# Patient Record
Sex: Female | Born: 1975 | Hispanic: No | Marital: Married | Smoking: Never smoker
Health system: Southern US, Community
[De-identification: ages and names within clinical notes are randomized; demographics above are authoritative.]

## PROBLEM LIST (undated history)

## (undated) ENCOUNTER — Inpatient Hospital Stay (HOSPITAL_COMMUNITY): Payer: Self-pay

## (undated) DIAGNOSIS — O99013 Anemia complicating pregnancy, third trimester: Principal | ICD-10-CM

## (undated) DIAGNOSIS — O30049 Twin pregnancy, dichorionic/diamniotic, unspecified trimester: Secondary | ICD-10-CM

## (undated) DIAGNOSIS — E079 Disorder of thyroid, unspecified: Secondary | ICD-10-CM

## (undated) DIAGNOSIS — E282 Polycystic ovarian syndrome: Secondary | ICD-10-CM

## (undated) DIAGNOSIS — E039 Hypothyroidism, unspecified: Secondary | ICD-10-CM

## (undated) DIAGNOSIS — O09813 Supervision of pregnancy resulting from assisted reproductive technology, third trimester: Secondary | ICD-10-CM

## (undated) HISTORY — PX: NO PAST SURGERIES: SHX2092

---

## 2015-02-04 ENCOUNTER — Other Ambulatory Visit (HOSPITAL_COMMUNITY)
Admission: RE | Admit: 2015-02-04 | Discharge: 2015-02-04 | Disposition: A | Discharge status: Home / Self Care | Payer: BLUE CROSS/BLUE SHIELD | Source: Ambulatory Visit | Attending: Nurse Practitioner | Admitting: Nurse Practitioner

## 2015-02-04 DIAGNOSIS — Z1151 Encounter for screening for human papillomavirus (HPV): Secondary | ICD-10-CM | POA: Insufficient documentation

## 2015-02-04 DIAGNOSIS — Z01419 Encounter for gynecological examination (general) (routine) without abnormal findings: Secondary | ICD-10-CM | POA: Insufficient documentation

## 2015-02-10 ENCOUNTER — Other Ambulatory Visit (HOSPITAL_COMMUNITY): Payer: Self-pay | Admitting: Nurse Practitioner

## 2015-02-10 DIAGNOSIS — N979 Female infertility, unspecified: Secondary | ICD-10-CM

## 2015-02-11 ENCOUNTER — Ambulatory Visit (HOSPITAL_COMMUNITY): Admission: RE | Admit: 2015-02-11 | Payer: BLUE CROSS/BLUE SHIELD | Source: Ambulatory Visit

## 2016-06-21 NOTE — L&D Delivery Note (Signed)
  Christine Noble, Christine Noble [[id number]]  Twin A:  Vaginal Delivery:  Patient was pushing well, getting discouraged. Tight perineal body noted and was holding back head delivery. Episiotomy discussed and agreed. Left medio-lateral episiotomy placed after injecting 1% plain Lidocaine.  At 7:52 PM a viable and healthy female was delivered via Vaginal, Spontaneous Delivery.  Presentation: vertex; Position: Left,, Occiput,, Anterior; Station: +5.  APGAR: 9, 9; weight 4 lb 7.3 oz (2021 g).     Body cord reduced at delivery and baby put on mom's abdomen.  Cord complications: body cord.  Cord pH: N/A Delayed cord clamping done (less than 1 minute) as baby needed stimulation and twin B position evaluation was needed. So baby handed off to RN in the warmer.   Twin B:  Bedside sono and pelvic exam done, twin B head was in right lower quadrant above pelvic inlet. Baby was made longitudinal by pushing from both sides on the abdomen by CNM Sigmon. Pelvic exam done, BBOW noted with head just above the inlet. Amniotomy done with FSE to creat small puncture and copious clear fluid drained. Suprapubic pressure maintained to keep the head from moving up.    Twin B deceleration started and vaginal exam noted station at -5 with cord prolapse. Mother was made supine and head was gently elevated to reduce cord while OR was called for C/section.  Mother pushed well and fetus descended in pelvis. Verbal consent for Vacuum was obtained and agreed.     Christine Noble, Christine Noble [[id number]][[id number]]  Operative Vaginal Delivery Note At 8:02 PM a viable and healthy female was delivered via Vaginal, Vacuum (Extractor), one application, 2 pushing, no pressure release in between.  Presentation: vertex; Position: Occiput,, Anterior; Station: +3.  APGAR: 8, 9; weight 4 lb 15 oz (2240 g).   Cord:  with the following complications: Cord prolapse but reduced to complete vaginal delivery Cord pH (arterial):  7.12 Immediate cord clamping was done and cut  and baby handed to RN for stimulation.   Anesthesia:  Epidural and 1% lidocaine local for episiotomy Instruments: Mushroom vacuum  Episiotomy: Left Mediolateral Lacerations:  None other  Suture Repair: 3.0 vicryl rapide Est. Blood Loss (mL): 600  Mom to postpartum.  Baby to Couplet care / Skin to Skin. Twin A smaller but >2000 gms, can with mother per NICU and will have nursery follow since stable.   Christine Noble 03/01/2017, 8:39 PM

## 2016-08-24 LAB — OB RESULTS CONSOLE ABO/RH: RH Type: POSITIVE

## 2016-08-24 LAB — OB RESULTS CONSOLE RPR: RPR: NONREACTIVE

## 2016-08-24 LAB — OB RESULTS CONSOLE GC/CHLAMYDIA
CHLAMYDIA, DNA PROBE: NEGATIVE
GC PROBE AMP, GENITAL: NEGATIVE

## 2016-08-24 LAB — OB RESULTS CONSOLE ANTIBODY SCREEN: ANTIBODY SCREEN: NEGATIVE

## 2016-08-24 LAB — OB RESULTS CONSOLE HIV ANTIBODY (ROUTINE TESTING): HIV: NONREACTIVE

## 2016-08-24 LAB — OB RESULTS CONSOLE RUBELLA ANTIBODY, IGM: Rubella: IMMUNE

## 2016-08-24 LAB — OB RESULTS CONSOLE HEPATITIS B SURFACE ANTIGEN: HEP B S AG: NEGATIVE

## 2016-12-30 ENCOUNTER — Other Ambulatory Visit (HOSPITAL_COMMUNITY): Payer: Self-pay | Admitting: Obstetrics & Gynecology

## 2016-12-30 DIAGNOSIS — O30043 Twin pregnancy, dichorionic/diamniotic, third trimester: Secondary | ICD-10-CM

## 2016-12-30 DIAGNOSIS — O09523 Supervision of elderly multigravida, third trimester: Secondary | ICD-10-CM

## 2017-01-11 ENCOUNTER — Encounter (HOSPITAL_COMMUNITY): Payer: Self-pay | Admitting: *Deleted

## 2017-01-12 ENCOUNTER — Other Ambulatory Visit (HOSPITAL_COMMUNITY): Payer: Self-pay | Admitting: Obstetrics & Gynecology

## 2017-01-12 ENCOUNTER — Encounter (HOSPITAL_COMMUNITY): Payer: Self-pay

## 2017-01-12 ENCOUNTER — Ambulatory Visit (HOSPITAL_COMMUNITY)
Admission: RE | Admit: 2017-01-12 | Discharge: 2017-01-12 | Disposition: A | Discharge status: Home / Self Care | Payer: BLUE CROSS/BLUE SHIELD | Source: Ambulatory Visit | Attending: Obstetrics & Gynecology | Admitting: Obstetrics & Gynecology

## 2017-01-12 DIAGNOSIS — Z3A30 30 weeks gestation of pregnancy: Secondary | ICD-10-CM | POA: Diagnosis not present

## 2017-01-12 DIAGNOSIS — E039 Hypothyroidism, unspecified: Secondary | ICD-10-CM | POA: Insufficient documentation

## 2017-01-12 DIAGNOSIS — O09523 Supervision of elderly multigravida, third trimester: Secondary | ICD-10-CM

## 2017-01-12 DIAGNOSIS — Z3689 Encounter for other specified antenatal screening: Secondary | ICD-10-CM | POA: Diagnosis not present

## 2017-01-12 DIAGNOSIS — O99213 Obesity complicating pregnancy, third trimester: Secondary | ICD-10-CM | POA: Insufficient documentation

## 2017-01-12 DIAGNOSIS — O30043 Twin pregnancy, dichorionic/diamniotic, third trimester: Secondary | ICD-10-CM

## 2017-01-12 DIAGNOSIS — O09513 Supervision of elderly primigravida, third trimester: Secondary | ICD-10-CM | POA: Insufficient documentation

## 2017-01-12 DIAGNOSIS — O09813 Supervision of pregnancy resulting from assisted reproductive technology, third trimester: Secondary | ICD-10-CM | POA: Diagnosis not present

## 2017-01-12 DIAGNOSIS — O99283 Endocrine, nutritional and metabolic diseases complicating pregnancy, third trimester: Secondary | ICD-10-CM | POA: Diagnosis not present

## 2017-01-12 HISTORY — DX: Disorder of thyroid, unspecified: E07.9

## 2017-01-12 HISTORY — DX: Polycystic ovarian syndrome: E28.2

## 2017-01-18 ENCOUNTER — Encounter (HOSPITAL_COMMUNITY): Payer: Self-pay

## 2017-01-25 ENCOUNTER — Other Ambulatory Visit: Payer: Self-pay | Admitting: Obstetrics & Gynecology

## 2017-01-27 ENCOUNTER — Encounter (HOSPITAL_COMMUNITY): Payer: Self-pay | Admitting: Obstetrics & Gynecology

## 2017-01-27 ENCOUNTER — Inpatient Hospital Stay (HOSPITAL_COMMUNITY)
Admission: AD | Admit: 2017-01-27 | Discharge: 2017-01-27 | Discharge status: Absent without leave | Payer: BLUE CROSS/BLUE SHIELD | Source: Ambulatory Visit | Attending: Obstetrics & Gynecology | Admitting: Obstetrics & Gynecology

## 2017-01-27 DIAGNOSIS — O30049 Twin pregnancy, dichorionic/diamniotic, unspecified trimester: Secondary | ICD-10-CM | POA: Diagnosis present

## 2017-01-27 DIAGNOSIS — O99013 Anemia complicating pregnancy, third trimester: Secondary | ICD-10-CM

## 2017-01-27 DIAGNOSIS — O09813 Supervision of pregnancy resulting from assisted reproductive technology, third trimester: Secondary | ICD-10-CM

## 2017-01-27 HISTORY — DX: Twin pregnancy, dichorionic/diamniotic, unspecified trimester: O30.049

## 2017-01-27 HISTORY — DX: Anemia complicating pregnancy, third trimester: O99.013

## 2017-01-27 HISTORY — DX: Supervision of pregnancy resulting from assisted reproductive technology, third trimester: O09.813

## 2017-01-27 NOTE — MAU Note (Signed)
Pt stated she will come back in the morning for her infusion.

## 2017-01-28 ENCOUNTER — Inpatient Hospital Stay (HOSPITAL_COMMUNITY)
Admission: AD | Admit: 2017-01-28 | Discharge: 2017-01-28 | Disposition: A | Discharge status: Home / Self Care | Payer: BLUE CROSS/BLUE SHIELD | Source: Ambulatory Visit | Attending: Obstetrics & Gynecology | Admitting: Obstetrics & Gynecology

## 2017-01-28 DIAGNOSIS — Z3A00 Weeks of gestation of pregnancy not specified: Secondary | ICD-10-CM | POA: Insufficient documentation

## 2017-01-28 DIAGNOSIS — O99019 Anemia complicating pregnancy, unspecified trimester: Secondary | ICD-10-CM | POA: Insufficient documentation

## 2017-01-28 MED ORDER — SODIUM CHLORIDE 0.9 % IV SOLN
510.0000 mg | Freq: Once | INTRAVENOUS | Status: AC
  Administered 2017-01-28: 510 mg via INTRAVENOUS
  Filled 2017-01-28: qty 17

## 2017-02-02 ENCOUNTER — Inpatient Hospital Stay (HOSPITAL_COMMUNITY)
Admission: AD | Admit: 2017-02-02 | Discharge: 2017-02-02 | Disposition: A | Discharge status: Home / Self Care | Payer: BLUE CROSS/BLUE SHIELD | Source: Ambulatory Visit | Attending: Obstetrics and Gynecology | Admitting: Obstetrics and Gynecology

## 2017-02-02 DIAGNOSIS — O26893 Other specified pregnancy related conditions, third trimester: Secondary | ICD-10-CM | POA: Diagnosis present

## 2017-02-02 DIAGNOSIS — Z3A33 33 weeks gestation of pregnancy: Secondary | ICD-10-CM | POA: Diagnosis not present

## 2017-02-02 DIAGNOSIS — D649 Anemia, unspecified: Secondary | ICD-10-CM | POA: Diagnosis present

## 2017-02-02 DIAGNOSIS — O09813 Supervision of pregnancy resulting from assisted reproductive technology, third trimester: Secondary | ICD-10-CM

## 2017-02-02 DIAGNOSIS — O99013 Anemia complicating pregnancy, third trimester: Secondary | ICD-10-CM

## 2017-02-02 MED ORDER — SODIUM CHLORIDE 0.9 % IV SOLN
510.0000 mg | Freq: Once | INTRAVENOUS | Status: AC
  Administered 2017-02-02: 510 mg via INTRAVENOUS
  Filled 2017-02-02: qty 17

## 2017-02-02 NOTE — MAU Note (Signed)
Here for IV iron

## 2017-02-11 LAB — OB RESULTS CONSOLE GBS: GBS: POSITIVE

## 2017-02-18 ENCOUNTER — Encounter (HOSPITAL_COMMUNITY): Payer: Self-pay | Admitting: *Deleted

## 2017-02-18 ENCOUNTER — Inpatient Hospital Stay (HOSPITAL_COMMUNITY): Payer: BLUE CROSS/BLUE SHIELD

## 2017-02-18 ENCOUNTER — Inpatient Hospital Stay (HOSPITAL_COMMUNITY)
Admission: AD | Admit: 2017-02-18 | Discharge: 2017-02-18 | Disposition: A | Discharge status: Home / Self Care | Payer: BLUE CROSS/BLUE SHIELD | Source: Ambulatory Visit | Attending: Obstetrics and Gynecology | Admitting: Obstetrics and Gynecology

## 2017-02-18 DIAGNOSIS — E282 Polycystic ovarian syndrome: Secondary | ICD-10-CM | POA: Diagnosis not present

## 2017-02-18 DIAGNOSIS — O99013 Anemia complicating pregnancy, third trimester: Secondary | ICD-10-CM | POA: Insufficient documentation

## 2017-02-18 DIAGNOSIS — O30043 Twin pregnancy, dichorionic/diamniotic, third trimester: Secondary | ICD-10-CM | POA: Diagnosis not present

## 2017-02-18 DIAGNOSIS — Z79899 Other long term (current) drug therapy: Secondary | ICD-10-CM | POA: Insufficient documentation

## 2017-02-18 DIAGNOSIS — E079 Disorder of thyroid, unspecified: Secondary | ICD-10-CM | POA: Insufficient documentation

## 2017-02-18 DIAGNOSIS — R030 Elevated blood-pressure reading, without diagnosis of hypertension: Secondary | ICD-10-CM

## 2017-02-18 DIAGNOSIS — O288 Other abnormal findings on antenatal screening of mother: Secondary | ICD-10-CM | POA: Insufficient documentation

## 2017-02-18 DIAGNOSIS — Z3689 Encounter for other specified antenatal screening: Secondary | ICD-10-CM | POA: Diagnosis not present

## 2017-02-18 DIAGNOSIS — Z3A35 35 weeks gestation of pregnancy: Secondary | ICD-10-CM | POA: Insufficient documentation

## 2017-02-18 DIAGNOSIS — O30033 Twin pregnancy, monochorionic/diamniotic, third trimester: Secondary | ICD-10-CM

## 2017-02-18 DIAGNOSIS — O99283 Endocrine, nutritional and metabolic diseases complicating pregnancy, third trimester: Secondary | ICD-10-CM | POA: Insufficient documentation

## 2017-02-18 LAB — URINALYSIS, ROUTINE W REFLEX MICROSCOPIC
Bilirubin Urine: NEGATIVE
Glucose, UA: NEGATIVE mg/dL
Hgb urine dipstick: NEGATIVE
Ketones, ur: NEGATIVE mg/dL
Leukocytes, UA: NEGATIVE
NITRITE: NEGATIVE
Protein, ur: NEGATIVE mg/dL
SPECIFIC GRAVITY, URINE: 1.008 (ref 1.005–1.030)
pH: 6 (ref 5.0–8.0)

## 2017-02-18 LAB — PROTEIN / CREATININE RATIO, URINE
CREATININE, URINE: 38 mg/dL
Total Protein, Urine: <6 mg/dL

## 2017-02-18 LAB — CBC
HEMATOCRIT: 36.4 % (ref 36.0–46.0)
Hemoglobin: 11.5 g/dL — ABNORMAL LOW (ref 12.0–15.0)
MCH: 23.3 pg — ABNORMAL LOW (ref 26.0–34.0)
MCHC: 31.6 g/dL (ref 30.0–36.0)
MCV: 73.8 fL — AB (ref 78.0–100.0)
PLATELETS: 264 10*3/uL (ref 150–400)
RBC: 4.93 MIL/uL (ref 3.87–5.11)
RDW: 15.6 % — ABNORMAL HIGH (ref 11.5–15.5)
WBC: 8.7 10*3/uL (ref 4.0–10.5)

## 2017-02-18 LAB — COMPREHENSIVE METABOLIC PANEL
ALT: 11 U/L — ABNORMAL LOW (ref 14–54)
AST: 15 U/L (ref 15–41)
Albumin: 2.4 g/dL — ABNORMAL LOW (ref 3.5–5.0)
Alkaline Phosphatase: 211 U/L — ABNORMAL HIGH (ref 38–126)
Anion gap: 8 (ref 5–15)
BUN: 6 mg/dL (ref 6–20)
CHLORIDE: 106 mmol/L (ref 101–111)
CO2: 23 mmol/L (ref 22–32)
CREATININE: 0.63 mg/dL (ref 0.44–1.00)
Calcium: 8.9 mg/dL (ref 8.9–10.3)
GFR calc Af Amer: >60 mL/min (ref >60)
GLUCOSE: 88 mg/dL (ref 65–99)
Potassium: 4.2 mmol/L (ref 3.5–5.1)
Sodium: 137 mmol/L (ref 135–145)
Total Bilirubin: 0.4 mg/dL (ref 0.3–1.2)
Total Protein: 6.6 g/dL (ref 6.5–8.1)

## 2017-02-18 NOTE — MAU Note (Signed)
Pt sent from office with 2/8 BPP of twins. Need further monitoring and repeat U/S.

## 2017-02-18 NOTE — Discharge Instructions (Signed)
Fetal Movement Counts °Patient Name: ________________________________________________ Patient Due Date: ____________________ °What is a fetal movement count? °A fetal movement count is the number of times that you feel your baby move during a certain amount of time. This may also be called a fetal kick count. A fetal movement count is recommended for every pregnant woman. You may be asked to start counting fetal movements as early as week 28 of your pregnancy. °Pay attention to when your baby is most active. You may notice your baby's sleep and wake cycles. You may also notice things that make your baby move more. You should do a fetal movement count: °· When your baby is normally most active. °· At the same time each day. ° °A good time to count movements is while you are resting, after having something to eat and drink. °How do I count fetal movements? °1. Find a quiet, comfortable area. Sit, or lie down on your side. °2. Write down the date, the start time and stop time, and the number of movements that you felt between those two times. Take this information with you to your health care visits. °3. For 2 hours, count kicks, flutters, swishes, rolls, and jabs. You should feel at least 10 movements during 2 hours. °4. You may stop counting after you have felt 10 movements. °5. If you do not feel 10 movements in 2 hours, have something to eat and drink. Then, keep resting and counting for 1 hour. If you feel at least 4 movements during that hour, you may stop counting. °Contact a health care provider if: °· You feel fewer than 4 movements in 2 hours. °· Your baby is not moving like he or she usually does. °Date: ____________ Start time: ____________ Stop time: ____________ Movements: ____________ °Date: ____________ Start time: ____________ Stop time: ____________ Movements: ____________ °Date: ____________ Start time: ____________ Stop time: ____________ Movements: ____________ °Date: ____________ Start time:  ____________ Stop time: ____________ Movements: ____________ °Date: ____________ Start time: ____________ Stop time: ____________ Movements: ____________ °Date: ____________ Start time: ____________ Stop time: ____________ Movements: ____________ °Date: ____________ Start time: ____________ Stop time: ____________ Movements: ____________ °Date: ____________ Start time: ____________ Stop time: ____________ Movements: ____________ °Date: ____________ Start time: ____________ Stop time: ____________ Movements: ____________ °This information is not intended to replace advice given to you by your health care provider. Make sure you discuss any questions you have with your health care provider. °Document Released: 07/07/2006 Document Revised: 02/04/2016 Document Reviewed: 07/17/2015 °Elsevier Interactive Patient Education © 2018 [employer] ° ° ° °Hypertension During Pregnancy °Hypertension, commonly called high blood pressure, is when the force of blood pumping through your arteries is too strong. Arteries are blood vessels that carry blood from the heart throughout the body. Hypertension during pregnancy can cause problems for you and your baby. Your baby may be born early (prematurely) or may not weigh as much as he or she should at birth. Very bad cases of hypertension during pregnancy can be life-threatening. °Different types of hypertension can occur during pregnancy. These include: °· Chronic hypertension. This happens when: °? You have hypertension before pregnancy and it continues during pregnancy. °? You develop hypertension before you are [redacted] weeks pregnant, and it continues during pregnancy. °· Gestational hypertension. This is hypertension that develops after the 20th week of pregnancy. °· Preeclampsia, also called toxemia of pregnancy. This is a very serious type of hypertension that develops only during pregnancy. It affects the whole body, and it can be very dangerous for you and   your baby. ° °Gestational  hypertension and preeclampsia usually go away within 6 weeks after your baby is born. Women who have hypertension during pregnancy have a greater chance of developing hypertension later in life or during future pregnancies. °What are the causes? °The exact cause of hypertension is not known. °What increases the risk? °There are certain factors that make it more likely for you to develop hypertension during pregnancy. These include: °· Having hypertension during a previous pregnancy or prior to pregnancy. °· Being overweight. °· Being older than age 40. °· Being pregnant for the first time or being pregnant with more than one baby. °· Becoming pregnant using fertilization methods such as IVF (in vitro fertilization). °· Having diabetes, kidney problems, or systemic lupus erythematosus. °· Having a family history of hypertension. ° °What are the signs or symptoms? °Chronic hypertension and gestational hypertension rarely cause symptoms. Preeclampsia causes symptoms, which may include: °· Increased protein in your urine. Your health care provider will check for this at every visit before you give birth (prenatal visit). °· Severe headaches. °· Sudden weight gain. °· Swelling of the hands, face, legs, and feet. °· Nausea and vomiting. °· Vision problems, such as blurred or double vision. °· Numbness in the face, arms, legs, and feet. °· Dizziness. °· Slurred speech. °· Sensitivity to bright lights. °· Abdominal pain. °· Convulsions. ° °How is this diagnosed? °You may be diagnosed with hypertension during a routine prenatal exam. At each prenatal visit, you may: °· Have a urine test to check for high amounts of protein in your urine. °· Have your blood pressure checked. A blood pressure reading is recorded as two numbers, such as "120 over 80" (or 120/80). The first ("top") number is called the systolic pressure. It is a measure of the pressure in your arteries when your heart beats. The second ("bottom") number is  called the diastolic pressure. It is a measure of the pressure in your arteries as your heart relaxes between beats. Blood pressure is measured in a unit called mm Hg. A normal blood pressure reading is: °? Systolic: below 120. °? Diastolic: below 80. ° °The type of hypertension that you are diagnosed with depends on your test results and when your symptoms developed. °· Chronic hypertension is usually diagnosed before 20 weeks of pregnancy. °· Gestational hypertension is usually diagnosed after 20 weeks of pregnancy. °· Hypertension with high amounts of protein in the urine is diagnosed as preeclampsia. °· Blood pressure measurements that stay above 160 systolic, or above 110 diastolic, are signs of severe preeclampsia. ° °How is this treated? °Treatment for hypertension during pregnancy varies depending on the type of hypertension you have and how serious it is. °· If you take medicines called ACE inhibitors to treat chronic hypertension, you may need to switch medicines. ACE inhibitors should not be taken during pregnancy. °· If you have gestational hypertension, you may need to take blood pressure medicine. °· If you are at risk for preeclampsia, your health care provider may recommend that you take a low-dose aspirin every day to prevent high blood pressure during your pregnancy. °· If you have severe preeclampsia, you may need to be hospitalized so you and your baby can be monitored closely. You may also need to take medicine (magnesium sulfate) to prevent seizures and to lower blood pressure. This medicine may be given as an injection or through an IV tube. °· In some cases, if your condition gets worse, you may need to deliver your baby   early. ° °Follow these instructions at home: °Eating and drinking °· Drink enough fluid to keep your urine clear or pale yellow. °· Eat a healthy diet that is low in salt (sodium). Do not add salt to your food. Check food labels to see how much sodium a food or beverage  contains. °Lifestyle °· Do not use any products that contain nicotine or tobacco, such as cigarettes and e-cigarettes. If you need help quitting, ask your health care provider. °· Do not use alcohol. °· Avoid caffeine. °· Avoid stress as much as possible. Rest and get plenty of sleep. °General instructions °· Take over-the-counter and prescription medicines only as told by your health care provider. °· While lying down, lie on your left side. This keeps pressure off your baby. °· While sitting or lying down, raise (elevate) your feet. Try putting some pillows under your lower legs. °· Exercise regularly. Ask your health care provider what kinds of exercise are best for you. °· Keep all prenatal and follow-up visits as told by your health care provider. This is important. °Contact a health care provider if: °· You have symptoms that your health care provider told you may require more treatment or monitoring, such as: °? Fever. °? Vomiting. °? Headache. °Get help right away if: °· You have severe abdominal pain or vomiting that does not get better with treatment. °· You suddenly develop swelling in your hands, ankles, or face. °· You gain 4 lbs (1.8 kg) or more in 1 week. °· You develop vaginal bleeding, or you have blood in your urine. °· You do not feel your baby moving as much as usual. °· You have blurred or double vision. °· You have muscle twitching or sudden tightening (spasms). °· You have shortness of breath. °· Your lips or fingernails turn blue. °This information is not intended to replace advice given to you by your health care provider. Make sure you discuss any questions you have with your health care provider. °Document Released: 02/23/2011 Document Revised: 12/26/2015 Document Reviewed: 11/21/2015 °Elsevier Interactive Patient Education © 2018 [employer] Inc. ° °

## 2017-02-18 NOTE — MAU Provider Note (Signed)
History     CSN: [id number]  Arrival date and time: 02/18/17 1540  First Provider Initiated Contact with Patient 02/18/17 1617      Chief Complaint  Patient presents with  . Non-stress Test   HPI Christine Noble is a 41 y.o. G1P0 at [redacted]w[redacted]d with di/di twins who presents from the office for monitoring. In office today had a BPP of 2/8. Was sent over for fetal monitoring & a repeat BPP. Patient denies abdominal pain, LOF, or vaginal bleeding. Reports positive fetal movement.   OB History    Gravida Para Term Preterm AB Living   1         0   SAB TAB Ectopic Multiple Live Births                  Past Medical History:  Diagnosis Date  . Anemia in pregnancy, third trimester 01/27/2017  . PCOS (polycystic ovarian syndrome)   . Pregnancy resulting from in vitro fertilization in third trimester 01/27/2017  . Thyroid disease   . Twin pregnancy, twins dichorionic and diamniotic 01/27/2017    Past Surgical History:  Procedure Laterality Date  . NO PAST SURGERIES      History reviewed. No pertinent family history.  Social History  Substance Use Topics  . Smoking status: Never Smoker  . Smokeless tobacco: Never Used  . Alcohol use No    Allergies: No Known Allergies  Prescriptions Prior to Admission  Medication Sig Dispense Refill Last Dose  . Prenatal Vit-Fe Fumarate-FA (PRENATAL VITAMIN PO) Take 1 tablet by mouth daily.    Past Week at Unknown time  . THYROID PO Take 50 mg by mouth daily. Medication comes from [place]   02/18/2017 at Unknown time    Review of Systems  Constitutional: Negative.   Gastrointestinal: Negative.   Genitourinary: Negative.    Physical Exam   Blood pressure 138/87, pulse 75, temperature 98.7 F (37.1 C), resp. rate 18, height 5\' 5"  (1.651 m), weight 244 lb (110.7 kg).  Patient Vitals for the past 24 hrs:  BP Temp Pulse Resp Height Weight  02/18/17 1934 138/85 - 67 - - -  02/18/17 1915 (!) 143/99 - 72 - - -  02/18/17 1900 (!) 139/95 - 70 - - -   02/18/17 1846 124/81 - 77 - - -  02/18/17 1835 (!) 144/89 - 76 - - -  02/18/17 1822 128/90 - 79 18 - -  02/18/17 1816 (!) 134/97 - 73 - - -  02/18/17 1549 138/87 98.7 F (37.1 C) 75 18 5\' 5"  (1.651 m) 244 lb (110.7 kg)     Physical Exam  Nursing note and vitals reviewed. Constitutional: She is oriented to person, place, and time. She appears well-developed and well-nourished. No distress.  HENT:  Head: Normocephalic and atraumatic.  Eyes: Conjunctivae are normal. Right eye exhibits no discharge. Left eye exhibits no discharge. No scleral icterus.  Respiratory: Effort normal. No respiratory distress.  Neurological: She is alert and oriented to person, place, and time.  Skin: She is not diaphoretic.  Psychiatric: She has a normal mood and affect. Her behavior is normal. Judgment and thought content normal.   Fetal Tracing: Baby A Baseline: 140 Variability: moderate Accelerations: 15x15 Decelerations: none  Baby B Baseline: 130 Variability: moderat Accelerations:15x15 Decelerations: ?variable w/intermittent tracing  Toco: irr UI MAU Course  Procedures Results for orders placed or performed during the hospital encounter of 02/18/17 (from the past 24 hour(s))  Urinalysis, Routine w reflex microscopic  Status: None   Collection Time: 02/18/17  6:25 PM  Result Value Ref Range   Color, Urine YELLOW YELLOW   APPearance CLEAR CLEAR   Specific Gravity, Urine 1.008 1.005 - 1.030   pH 6.0 5.0 - 8.0   Glucose, UA NEGATIVE NEGATIVE mg/dL   Hgb urine dipstick NEGATIVE NEGATIVE   Bilirubin Urine NEGATIVE NEGATIVE   Ketones, ur NEGATIVE NEGATIVE mg/dL   Protein, ur NEGATIVE NEGATIVE mg/dL   Nitrite NEGATIVE NEGATIVE   Leukocytes, UA NEGATIVE NEGATIVE  Protein / creatinine ratio, urine     Status: None   Collection Time: 02/18/17  6:25 PM  Result Value Ref Range   Creatinine, Urine 38.00 mg/dL   Total Protein, Urine <6 mg/dL   Protein Creatinine Ratio        0.00 - 0.15  mg/mg[Cre]  CBC     Status: Abnormal   Collection Time: 02/18/17  6:42 PM  Result Value Ref Range   WBC 8.7 4.0 - 10.5 K/uL   RBC 4.93 3.87 - 5.11 MIL/uL   Hemoglobin 11.5 (L) 12.0 - 15.0 g/dL   HCT 16.136.4 09.636.0 - 04.546.0 %   MCV 73.8 (L) 78.0 - 100.0 fL   MCH 23.3 (L) 26.0 - 34.0 pg   MCHC 31.6 30.0 - 36.0 g/dL   RDW 40.915.6 (H) 81.111.5 - 91.415.5 %   Platelets 264 150 - 400 K/uL  Comprehensive metabolic panel     Status: Abnormal   Collection Time: 02/18/17  6:42 PM  Result Value Ref Range   Sodium 137 135 - 145 mmol/L   Potassium 4.2 3.5 - 5.1 mmol/L   Chloride 106 101 - 111 mmol/L   CO2 23 22 - 32 mmol/L   Glucose, Bld 88 65 - 99 mg/dL   BUN 6 6 - 20 mg/dL   Creatinine, Ser 7.820.63 0.44 - 1.00 mg/dL   Calcium 8.9 8.9 - 95.610.3 mg/dL   Total Protein 6.6 6.5 - 8.1 g/dL   Albumin 2.4 (L) 3.5 - 5.0 g/dL   AST 15 15 - 41 U/L   ALT 11 (L) 14 - 54 U/L   Alkaline Phosphatase 211 (H) 38 - 126 U/L   Total Bilirubin 0.4 0.3 - 1.2 mg/dL   GFR calc non Af Amer >60 >60 mL/min   GFR calc Af Amer >60 >60 mL/min   Anion gap 8 5 - 15   No results found.   MDM Reactive fetal tracing BPP ordered -- 8/8 x 2 with normal AFI x 2 At time of discharge, BP elevated. PreE labs ordered --- CBC, CMP, urine PCR. Labs normal. No severe range BPs. Pt asymptomatic.  Discussed with Dr. Waldo Noble. Ok to discharge home. Plan of patient to return Monday morning for BPP, NST, & BP check.  Assessment and Plan  A; 1. NST (non-stress test) reactive   2. [redacted] weeks gestation of pregnancy   3. Twin pregnancy, dichorionic/diamniotic, third trimester   4. Elevated BP without diagnosis of hypertension    P: Discharge home Discussed reasons to return to MAU Return Monday morning around 9 am for BPP, NST, & BP check Preeclampsia precautions  Christine Noble 02/18/2017, 4:16 PM

## 2017-02-21 ENCOUNTER — Encounter (HOSPITAL_COMMUNITY): Payer: Self-pay

## 2017-02-21 ENCOUNTER — Inpatient Hospital Stay (HOSPITAL_COMMUNITY)
Admission: AD | Admit: 2017-02-21 | Discharge: 2017-02-21 | Disposition: A | Discharge status: Home / Self Care | Payer: BLUE CROSS/BLUE SHIELD | Source: Ambulatory Visit | Attending: Obstetrics and Gynecology | Admitting: Obstetrics and Gynecology

## 2017-02-21 ENCOUNTER — Inpatient Hospital Stay (HOSPITAL_COMMUNITY): Payer: BLUE CROSS/BLUE SHIELD

## 2017-02-21 DIAGNOSIS — O30003 Twin pregnancy, unspecified number of placenta and unspecified number of amniotic sacs, third trimester: Secondary | ICD-10-CM

## 2017-02-21 DIAGNOSIS — O288 Other abnormal findings on antenatal screening of mother: Secondary | ICD-10-CM | POA: Diagnosis not present

## 2017-02-21 DIAGNOSIS — O30043 Twin pregnancy, dichorionic/diamniotic, third trimester: Secondary | ICD-10-CM | POA: Diagnosis not present

## 2017-02-21 DIAGNOSIS — Z3A36 36 weeks gestation of pregnancy: Secondary | ICD-10-CM | POA: Diagnosis not present

## 2017-02-21 LAB — CBC
HCT: 36.5 % (ref 36.0–46.0)
Hemoglobin: 11.6 g/dL — ABNORMAL LOW (ref 12.0–15.0)
MCH: 23.3 pg — ABNORMAL LOW (ref 26.0–34.0)
MCHC: 31.8 g/dL (ref 30.0–36.0)
MCV: 73.3 fL — AB (ref 78.0–100.0)
PLATELETS: 247 10*3/uL (ref 150–400)
RBC: 4.98 MIL/uL (ref 3.87–5.11)
RDW: 15.5 % (ref 11.5–15.5)
WBC: 7.3 10*3/uL (ref 4.0–10.5)

## 2017-02-21 LAB — LACTATE DEHYDROGENASE: LDH: 127 U/L (ref 98–192)

## 2017-02-21 LAB — COMPREHENSIVE METABOLIC PANEL
ALBUMIN: 2.4 g/dL — AB (ref 3.5–5.0)
ALT: 11 U/L — ABNORMAL LOW (ref 14–54)
ANION GAP: 8 (ref 5–15)
AST: 15 U/L (ref 15–41)
Alkaline Phosphatase: 218 U/L — ABNORMAL HIGH (ref 38–126)
BUN: 7 mg/dL (ref 6–20)
CO2: 22 mmol/L (ref 22–32)
Calcium: 8.8 mg/dL — ABNORMAL LOW (ref 8.9–10.3)
Chloride: 103 mmol/L (ref 101–111)
Creatinine, Ser: 0.63 mg/dL (ref 0.44–1.00)
GFR calc non Af Amer: >60 mL/min (ref >60)
GLUCOSE: 79 mg/dL (ref 65–99)
POTASSIUM: 4.1 mmol/L (ref 3.5–5.1)
SODIUM: 133 mmol/L — AB (ref 135–145)
TOTAL PROTEIN: 6.6 g/dL (ref 6.5–8.1)
Total Bilirubin: 0.8 mg/dL (ref 0.3–1.2)

## 2017-02-21 LAB — PROTEIN / CREATININE RATIO, URINE: CREATININE, URINE: 24 mg/dL

## 2017-02-21 LAB — URIC ACID: Uric Acid, Serum: 6.1 mg/dL (ref 2.3–6.6)

## 2017-02-21 IMAGING — US USMFM FETAL BPP W/O NON-STRESS ADDL GEST
1 series · 12 of 16 positions shown · non-contrast
Comparison: none

[Series 1: usmfm fetal bpp w/o non-stress addl gest · 16 acquisitions, 12 frames shown]
[im 1/16]
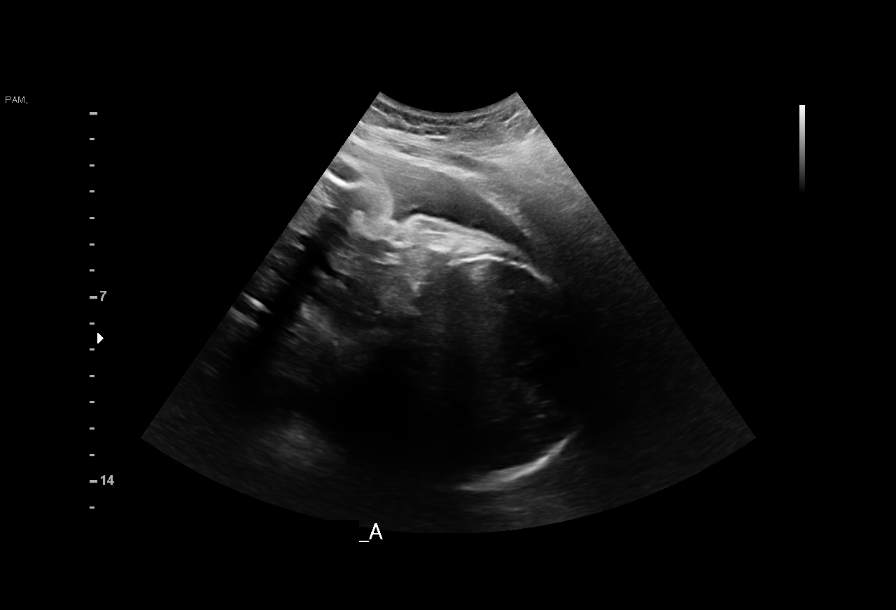
[im 3/16]
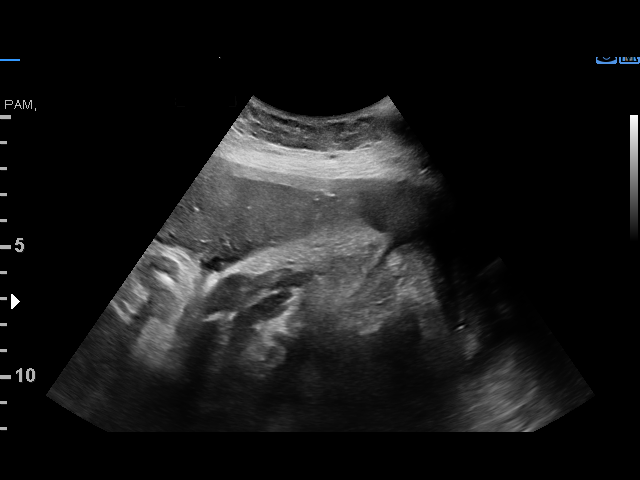
[im 4/16]
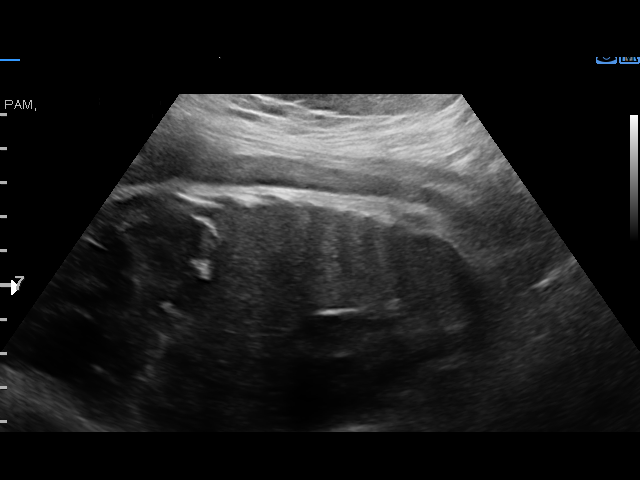
[im 5/16]
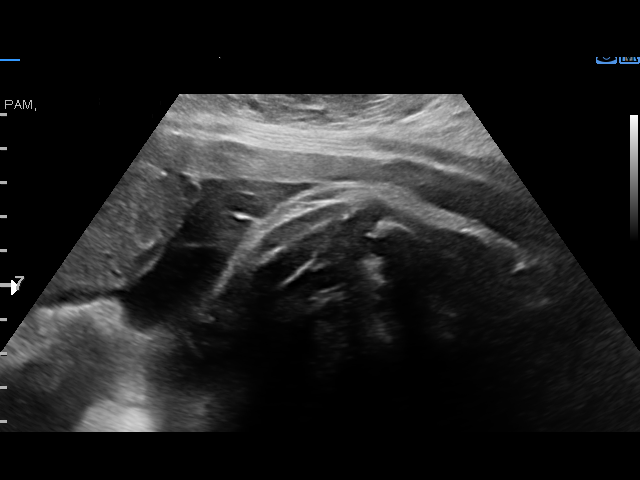
[im 7/16]
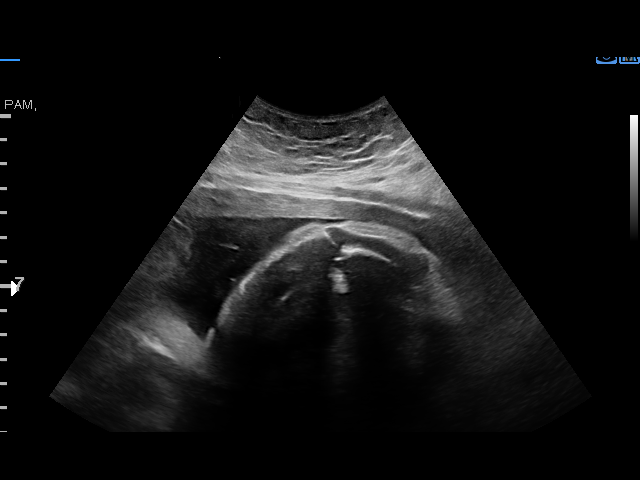
[im 8/16]
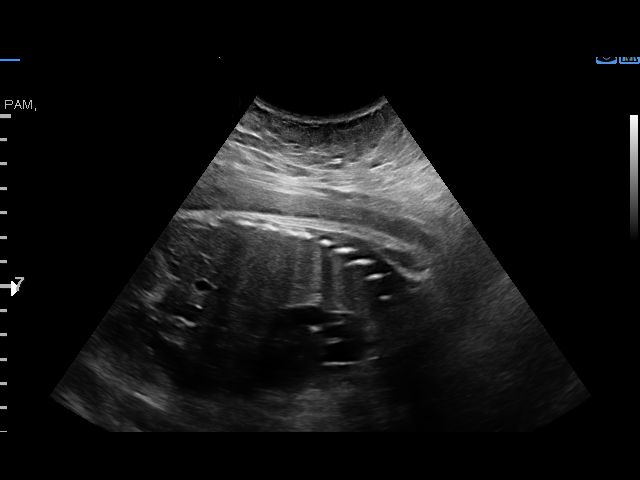
[im 9/16]
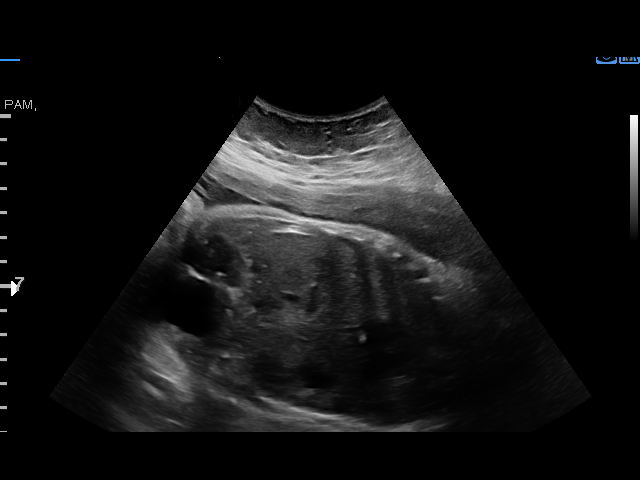
[im 11/16]
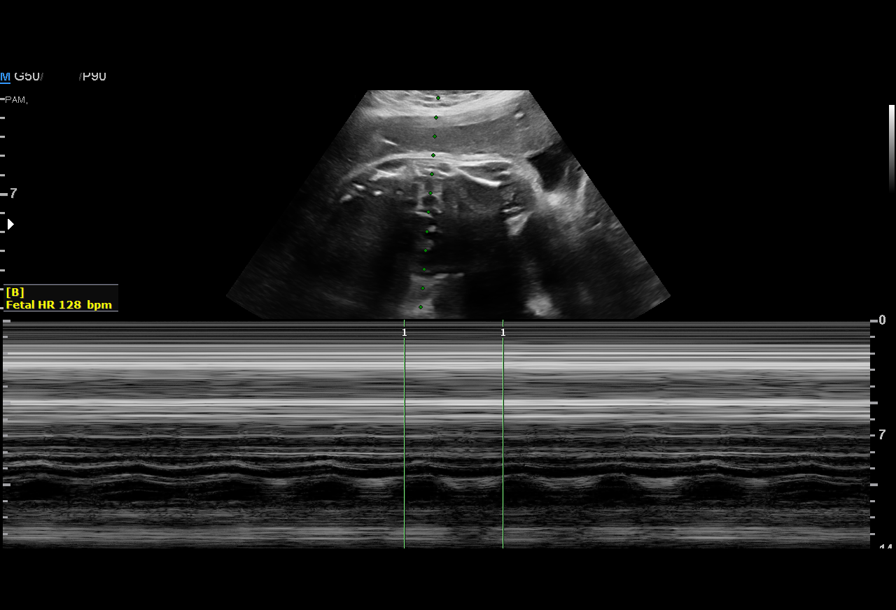
[im 12/16]
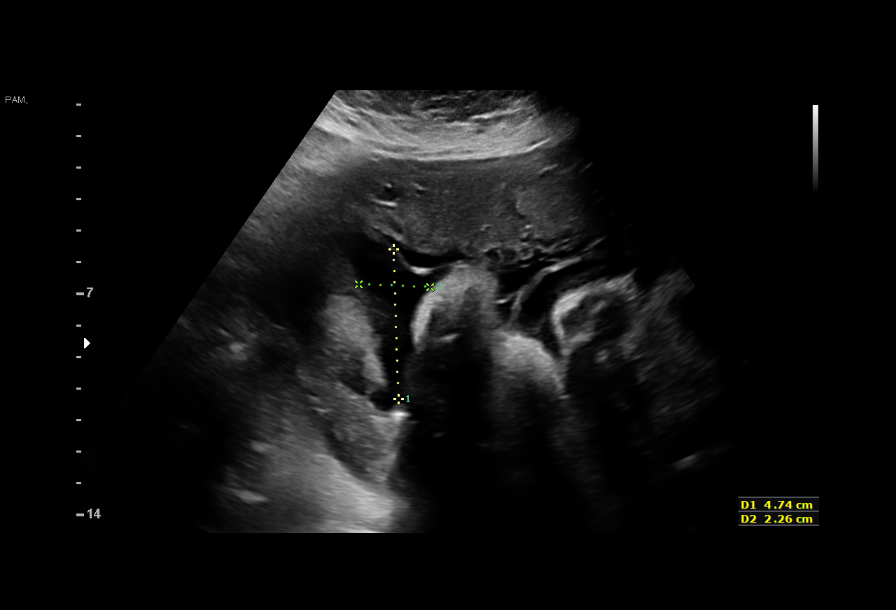
[im 13/16]
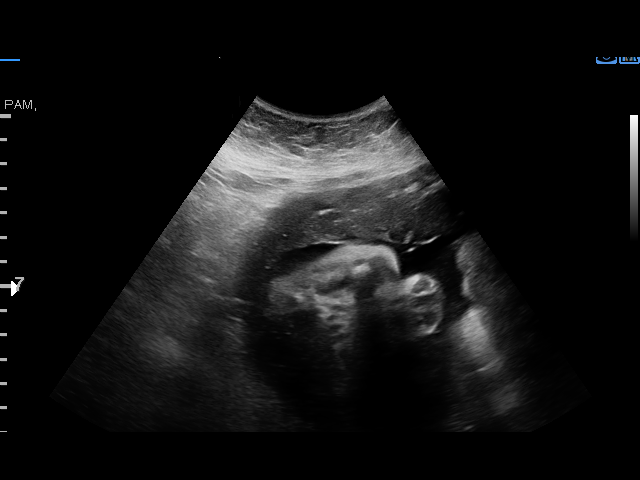
[im 15/16]
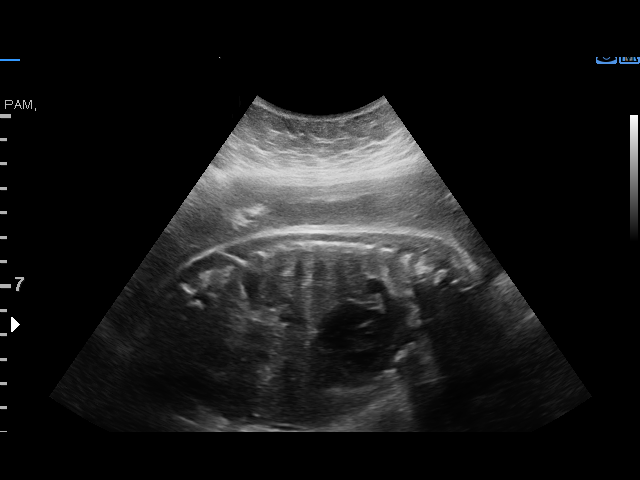
[im 16/16]
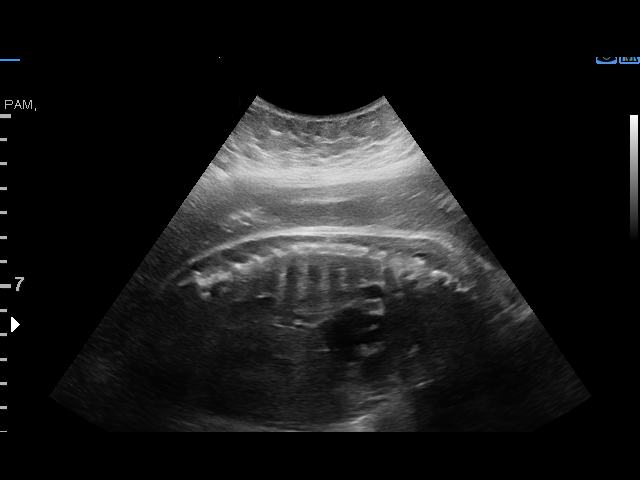

[12 of 16 positions shown; findings below may reference images not displayed]

MAU/Triage

GESTATION

Indications

36 weeks gestation of pregnancy
Twin pregnancy, di/di, third trimester         [4A]
Advanced maternal age primigravida 35+,        [4A]
third trimester; low risk NIPS
Pregnancy resulting from assisted              [4A]
reproductive technology (IVF)
Hypothyroid                                    [4A] [4A]
Obesity complicating pregnancy, third          [4A]
trimester
Gestational hypertension without significant   [4A]
proteinuria, third trimester
OB History

Blood Type:            Height:  5'5"   Weight (lb):           BMI:
Gravidity:    1         Term:   0        Prem:   0        SAB:   0
TOP:          0       Ectopic:  0        Living: 0
Fetal Evaluation (Fetus A)

Num Of Fetuses:     2
Fetal Heart         132
Rate(bpm):
Cardiac Activity:   Observed
Fetal Lie:          Left Fetus
Presentation:       Cephalic
Membrane Desc:      Dividing Membrane seen
Amniotic Fluid
AFI FV:      Subjectively within normal limits

Largest Pocket(cm)
3.3
Biophysical Evaluation (Fetus A)

Amniotic F.V:   Within normal limits       F. Tone:        Observed
F. Movement:    Observed                   Score:          [DATE]
F. Breathing:   Observed
Gestational Age (Fetus A)

Best:          36w 2d    Det. By:   Embryo Transfer          EDD:   [DATE]

Fetal Evaluation (Fetus B)

Num Of Fetuses:     2
Fetal Heart         128
Rate(bpm):
Cardiac Activity:   Observed
Fetal Lie:          Right Fetus
Presentation:       Cephalic
Membrane Desc:      Dividing Membrane seen

Amniotic Fluid
AFI FV:      Subjectively within normal limits

Largest Pocket(cm)
4.7
Biophysical Evaluation (Fetus B)

Amniotic F.V:   Within normal limits       F. Tone:        Observed
F. Movement:    Observed                   Score:          [DATE]
F. Breathing:   Observed
Gestational Age (Fetus B)

Best:          36w 2d    Det. By:   Embryo Transfer          EDD:   [DATE]
Impression

Dichorionic/diamniotic twin pregnancy at 36+2 weeks
Cephalic/cephalic presentation
Normal amniotic fluid volume x 2
BPP [DATE] x 2
Recommendations

Follow-up as clinically indicated

## 2017-02-21 NOTE — MAU Note (Signed)
Pt here for outpt NST, BPP, and BP check. No lbeeding, no LOF, no pain.

## 2017-02-21 NOTE — MAU Provider Note (Signed)
Chief Complaint:  Non-stress Test   None     HPI: Christine Noble is a 41 y.o. G1P0 at [redacted]w[redacted]d with IVF di/di twins pregnancy who presents to maternity admissions as a follow up visit from her previous MAU visit on 02/18/17. She was sent to the office on 8/31 for BPP of 2/8.  In MAU, her BPP was repeated and was 8/8 x 2.  Her BP was elevated for the first time, however, during her MAU visit so labwork was done which was normal. She denies any preeclampsia symptoms today with no h/a, epigastric pain, or visual disturbances.  She returns today for NST and BPP x 2 and BP check as instructed by her provider.  She denies any abdominal pain or other symptoms.  She feels normal fetal movement.   HPI  Past Medical History: Past Medical History:  Diagnosis Date  . Anemia in pregnancy, third trimester 01/27/2017  . PCOS (polycystic ovarian syndrome)   . Pregnancy resulting from in vitro fertilization in third trimester 01/27/2017  . Thyroid disease   . Twin pregnancy, twins dichorionic and diamniotic 01/27/2017    Past obstetric history: OB History  Gravida Para Term Preterm AB Living  1         0  SAB TAB Ectopic Multiple Live Births               # Outcome Date GA Lbr Len/2nd Weight Sex Delivery Anes PTL Lv  1 Current               Past Surgical History: Past Surgical History:  Procedure Laterality Date  . NO PAST SURGERIES      Family History: History reviewed. No pertinent family history.  Social History: Social History  Substance Use Topics  . Smoking status: Never Smoker  . Smokeless tobacco: Never Used  . Alcohol use No    Allergies: No Known Allergies  Meds:  No prescriptions prior to admission.    ROS:  Review of Systems  Constitutional: Negative for chills, fatigue and fever.  Eyes: Negative for visual disturbance.  Respiratory: Negative for shortness of breath.   Cardiovascular: Negative for chest pain.  Gastrointestinal: Negative for abdominal pain, nausea and  vomiting.  Genitourinary: Negative for difficulty urinating, dysuria, flank pain, pelvic pain, vaginal bleeding, vaginal discharge and vaginal pain.  Neurological: Negative for dizziness and headaches.  Psychiatric/Behavioral: Negative.      I have reviewed patient's Past Medical Hx, Surgical Hx, Family Hx, Social Hx, medications and allergies.   Physical Exam   Patient Vitals for the past 24 hrs:  BP Temp Pulse Resp  02/21/17 1316 (!) 134/93 - 71 -  02/21/17 1301 136/89 - 68 -  02/21/17 1200 (!) 126/92 - 82 -  02/21/17 1146 (!) 129/93 - 75 -  02/21/17 1131 125/87 - 69 -  02/21/17 1116 127/89 - 72 -  02/21/17 1106 (!) 130/93 98.4 F (36.9 C) 79 20   Constitutional: Well-developed, well-nourished female in no acute distress.  Cardiovascular: normal rate Respiratory: normal effort GI: Abd soft, non-tender, gravid appropriate for gestational age.  MS: Extremities nontender, no edema, normal ROM Neurologic: Alert and oriented x 4.  GU: Neg CVAT.  PELVIC EXAM: Cervix pink, visually closed, without lesion, scant white creamy discharge, vaginal walls and external genitalia normal Bimanual exam: Cervix 0/long/high, firm, anterior, neg CMT, uterus nontender, nonenlarged, adnexa without tenderness, enlargement, or mass     FHT Baby A: Baseline 135 , moderate variability, accelerations present, no decelerations  FHT Baby B: Baseline 135 , moderate variability, accelerations present, no decelerations Contractions: rare, mild to palpation   Labs: Results for orders placed or performed during the hospital encounter of 02/21/17 (from the past 24 hour(s))  CBC     Status: Abnormal   Collection Time: 02/21/17 11:24 AM  Result Value Ref Range   WBC 7.3 4.0 - 10.5 K/uL   RBC 4.98 3.87 - 5.11 MIL/uL   Hemoglobin 11.6 (L) 12.0 - 15.0 g/dL   HCT 04.536.5 40.936.0 - 81.146.0 %   MCV 73.3 (L) 78.0 - 100.0 fL   MCH 23.3 (L) 26.0 - 34.0 pg   MCHC 31.8 30.0 - 36.0 g/dL   RDW 91.415.5 78.211.5 - 95.615.5 %    Platelets 247 150 - 400 K/uL  Comprehensive metabolic panel     Status: Abnormal   Collection Time: 02/21/17 11:24 AM  Result Value Ref Range   Sodium 133 (L) 135 - 145 mmol/L   Potassium 4.1 3.5 - 5.1 mmol/L   Chloride 103 101 - 111 mmol/L   CO2 22 22 - 32 mmol/L   Glucose, Bld 79 65 - 99 mg/dL   BUN 7 6 - 20 mg/dL   Creatinine, Ser 2.130.63 0.44 - 1.00 mg/dL   Calcium 8.8 (L) 8.9 - 10.3 mg/dL   Total Protein 6.6 6.5 - 8.1 g/dL   Albumin 2.4 (L) 3.5 - 5.0 g/dL   AST 15 15 - 41 U/L   ALT 11 (L) 14 - 54 U/L   Alkaline Phosphatase 218 (H) 38 - 126 U/L   Total Bilirubin 0.8 0.3 - 1.2 mg/dL   GFR calc non Af Amer >60 >60 mL/min   GFR calc Af Amer >60 >60 mL/min   Anion gap 8 5 - 15  Uric acid     Status: None   Collection Time: 02/21/17 11:24 AM  Result Value Ref Range   Uric Acid, Serum 6.1 2.3 - 6.6 mg/dL  Lactate dehydrogenase     Status: None   Collection Time: 02/21/17 11:24 AM  Result Value Ref Range   LDH 127 98 - 192 U/L  Protein / creatinine ratio, urine     Status: None   Collection Time: 02/21/17 11:30 AM  Result Value Ref Range   Creatinine, Urine 24.00 mg/dL   Total Protein, Urine <6 mg/dL   Protein Creatinine Ratio        0.00 - 0.15 mg/mg[Cre]      Imaging:  Preliminary report MFM BPP 8 out of 8 x 2 today  MAU Course/MDM: I have ordered labs and reviewed results.  Preeclampsia labs wnl with P/C ratio too low to measure NST reviewed and reactive x 2 Consult Dr Juliene PinaMody with presentation, exam findings and test results.  Pt to f/u in office as scheduled Pt stable at time of discharge   Assessment: 1. Dichorionic diamniotic twin pregnancy in third trimester   2. NST (non-stress test) nonreactive   3. Twin gestation in third trimester, unspecified multiple gestation type   4. Non-reactive NST (non-stress test)     Plan: Discharge home Labor precautions and fetal kick counts  Follow-up Information    [name], [name], MD Follow up.   Specialty:  Obstetrics  and Gynecology Why:  As scheduled, return to MAU as needed for signs of labor or emergencies Contact information: [place] [address] [place] [id number] [phone]          Allergies as of 02/21/2017   No Known Allergies  Medication List    TAKE these medications   PRENATAL VITAMIN PO Take 1 tablet by mouth daily.   THYROID PO Take 50 mg by mouth daily. Medication comes from [place]            Discharge Care Instructions        Start     Ordered   02/21/17 0000  Discharge patient    Question Answer Comment  Discharge disposition 01-Home or Self Care   Discharge patient date 02/21/2017      02/21/17 1325      Sharen Counter Certified Nurse-Midwife 02/21/2017 2:09 PM

## 2017-02-28 ENCOUNTER — Telehealth (HOSPITAL_COMMUNITY): Payer: Self-pay | Admitting: *Deleted

## 2017-02-28 ENCOUNTER — Other Ambulatory Visit: Payer: Self-pay | Admitting: Obstetrics

## 2017-02-28 NOTE — Telephone Encounter (Signed)
Preadmission screen  

## 2017-03-01 ENCOUNTER — Inpatient Hospital Stay (HOSPITAL_COMMUNITY): Payer: BLUE CROSS/BLUE SHIELD | Admitting: Anesthesiology

## 2017-03-01 ENCOUNTER — Encounter (HOSPITAL_COMMUNITY): Payer: Self-pay

## 2017-03-01 ENCOUNTER — Inpatient Hospital Stay (HOSPITAL_COMMUNITY)
Admission: RE | Admit: 2017-03-01 | Discharge: 2017-03-03 | DRG: 775 | Disposition: A | Discharge status: Home / Self Care | Payer: BLUE CROSS/BLUE SHIELD | Source: Ambulatory Visit | Attending: Obstetrics & Gynecology | Admitting: Obstetrics & Gynecology

## 2017-03-01 DIAGNOSIS — O99824 Streptococcus B carrier state complicating childbirth: Secondary | ICD-10-CM | POA: Diagnosis present

## 2017-03-01 DIAGNOSIS — Z3A37 37 weeks gestation of pregnancy: Secondary | ICD-10-CM | POA: Diagnosis not present

## 2017-03-01 DIAGNOSIS — Z6839 Body mass index (BMI) 39.0-39.9, adult: Secondary | ICD-10-CM

## 2017-03-01 DIAGNOSIS — Z3490 Encounter for supervision of normal pregnancy, unspecified, unspecified trimester: Secondary | ICD-10-CM | POA: Diagnosis present

## 2017-03-01 DIAGNOSIS — Y92230 Patient room in hospital as the place of occurrence of the external cause: Secondary | ICD-10-CM | POA: Diagnosis present

## 2017-03-01 DIAGNOSIS — O365931 Maternal care for other known or suspected poor fetal growth, third trimester, fetus 1: Secondary | ICD-10-CM | POA: Diagnosis present

## 2017-03-01 DIAGNOSIS — W1830XA Fall on same level, unspecified, initial encounter: Secondary | ICD-10-CM | POA: Diagnosis present

## 2017-03-01 DIAGNOSIS — O09813 Supervision of pregnancy resulting from assisted reproductive technology, third trimester: Secondary | ICD-10-CM

## 2017-03-01 DIAGNOSIS — D62 Acute posthemorrhagic anemia: Secondary | ICD-10-CM | POA: Diagnosis not present

## 2017-03-01 DIAGNOSIS — O99013 Anemia complicating pregnancy, third trimester: Secondary | ICD-10-CM

## 2017-03-01 DIAGNOSIS — O9081 Anemia of the puerperium: Secondary | ICD-10-CM | POA: Diagnosis not present

## 2017-03-01 DIAGNOSIS — O99284 Endocrine, nutritional and metabolic diseases complicating childbirth: Secondary | ICD-10-CM | POA: Diagnosis present

## 2017-03-01 DIAGNOSIS — O99214 Obesity complicating childbirth: Secondary | ICD-10-CM | POA: Diagnosis present

## 2017-03-01 DIAGNOSIS — Z3A36 36 weeks gestation of pregnancy: Secondary | ICD-10-CM

## 2017-03-01 DIAGNOSIS — E039 Hypothyroidism, unspecified: Secondary | ICD-10-CM | POA: Diagnosis present

## 2017-03-01 DIAGNOSIS — O30043 Twin pregnancy, dichorionic/diamniotic, third trimester: Secondary | ICD-10-CM | POA: Diagnosis present

## 2017-03-01 DIAGNOSIS — O30049 Twin pregnancy, dichorionic/diamniotic, unspecified trimester: Secondary | ICD-10-CM | POA: Diagnosis present

## 2017-03-01 HISTORY — DX: Hypothyroidism, unspecified: E03.9

## 2017-03-01 LAB — PROTEIN / CREATININE RATIO, URINE
Creatinine, Urine: 36 mg/dL
Protein Creatinine Ratio: 0.22 mg/mg{Cre} — ABNORMAL HIGH (ref 0.00–0.15)
Total Protein, Urine: 8 mg/dL

## 2017-03-01 LAB — RPR: RPR Ser Ql: NONREACTIVE

## 2017-03-01 LAB — CBC
HEMATOCRIT: 36.8 % (ref 36.0–46.0)
HEMATOCRIT: 35.5 % — AB (ref 36.0–46.0)
HEMOGLOBIN: 11.4 g/dL — AB (ref 12.0–15.0)
Hemoglobin: 11.8 g/dL — ABNORMAL LOW (ref 12.0–15.0)
MCH: 23.7 pg — AB (ref 26.0–34.0)
MCH: 23.8 pg — AB (ref 26.0–34.0)
MCHC: 32.1 g/dL (ref 30.0–36.0)
MCHC: 32.1 g/dL (ref 30.0–36.0)
MCV: 74 fL — AB (ref 78.0–100.0)
MCV: 74 fL — AB (ref 78.0–100.0)
Platelets: 249 10*3/uL (ref 150–400)
Platelets: 237 10*3/uL (ref 150–400)
RBC: 4.97 MIL/uL (ref 3.87–5.11)
RBC: 4.8 MIL/uL (ref 3.87–5.11)
RDW: 15.5 % (ref 11.5–15.5)
RDW: 15.6 % — ABNORMAL HIGH (ref 11.5–15.5)
WBC: 6.7 10*3/uL (ref 4.0–10.5)
WBC: 6.8 10*3/uL (ref 4.0–10.5)

## 2017-03-01 LAB — COMPREHENSIVE METABOLIC PANEL
ALBUMIN: 2.4 g/dL — AB (ref 3.5–5.0)
ALK PHOS: 259 U/L — AB (ref 38–126)
ALT: 10 U/L — ABNORMAL LOW (ref 14–54)
ANION GAP: 8 (ref 5–15)
AST: 17 U/L (ref 15–41)
BUN: 6 mg/dL (ref 6–20)
CALCIUM: 8.8 mg/dL — AB (ref 8.9–10.3)
CHLORIDE: 105 mmol/L (ref 101–111)
CO2: 21 mmol/L — AB (ref 22–32)
Creatinine, Ser: 0.66 mg/dL (ref 0.44–1.00)
GFR calc Af Amer: >60 mL/min (ref >60)
GFR calc non Af Amer: >60 mL/min (ref >60)
GLUCOSE: 81 mg/dL (ref 65–99)
POTASSIUM: 4.3 mmol/L (ref 3.5–5.1)
SODIUM: 134 mmol/L — AB (ref 135–145)
Total Bilirubin: 0.7 mg/dL (ref 0.3–1.2)
Total Protein: 6.6 g/dL (ref 6.5–8.1)

## 2017-03-01 LAB — ABO/RH: ABO/RH(D): B POS

## 2017-03-01 LAB — TYPE AND SCREEN
ABO/RH(D): B POS
ANTIBODY SCREEN: NEGATIVE

## 2017-03-01 MED ORDER — SIMETHICONE 80 MG PO CHEW: 80.0000 mg | CHEWABLE_TABLET | ORAL | Status: DC | PRN

## 2017-03-01 MED ORDER — TETANUS-DIPHTH-ACELL PERTUSSIS 5-2.5-18.5 LF-MCG/0.5 IM SUSP: 0.5000 mL | Freq: Once | INTRAMUSCULAR | Status: DC

## 2017-03-01 MED ORDER — ACETAMINOPHEN 325 MG PO TABS: 650.0000 mg | ORAL_TABLET | ORAL | Status: DC | PRN

## 2017-03-01 MED ORDER — DEXTROSE 5 % IV SOLN
2.5000 10*6.[IU] | INTRAVENOUS | Status: DC
Start: 2017-03-01 — End: 2017-03-01

## 2017-03-01 MED ORDER — OXYTOCIN 40 UNITS IN LACTATED RINGERS INFUSION - SIMPLE MED: 1.0000 m[IU]/min | INTRAVENOUS | Status: DC

## 2017-03-01 MED ORDER — EPHEDRINE 5 MG/ML INJ: 10.0000 mg | INTRAVENOUS | Status: DC | PRN

## 2017-03-01 MED ORDER — DIPHENHYDRAMINE HCL 25 MG PO CAPS: 25.0000 mg | ORAL_CAPSULE | Freq: Four times a day (QID) | ORAL | Status: DC | PRN

## 2017-03-01 MED ORDER — LIDOCAINE HCL (PF) 1 % IJ SOLN
INTRAMUSCULAR | Status: AC
  Filled 2017-03-01: qty 30

## 2017-03-01 MED ORDER — PENICILLIN G POTASSIUM 5000000 UNITS IJ SOLR
5.0000 10*6.[IU] | Freq: Once | INTRAVENOUS | Status: AC
  Administered 2017-03-01: 5 10*6.[IU] via INTRAVENOUS
  Filled 2017-03-01: qty 5

## 2017-03-01 MED ORDER — OXYTOCIN 40 UNITS IN LACTATED RINGERS INFUSION - SIMPLE MED
1.0000 m[IU]/min | INTRAVENOUS | Status: DC
  Administered 2017-03-01: 1 m[IU]/min via INTRAVENOUS

## 2017-03-01 MED ORDER — LACTATED RINGERS IV SOLN
INTRAVENOUS | Status: DC
  Administered 2017-03-01 (×2): via INTRAVENOUS

## 2017-03-01 MED ORDER — SENNOSIDES-DOCUSATE SODIUM 8.6-50 MG PO TABS
2.0000 | ORAL_TABLET | ORAL | Status: DC
  Administered 2017-03-02 – 2017-03-03 (×2): 2 via ORAL
  Filled 2017-03-01 (×2): qty 2

## 2017-03-01 MED ORDER — PENICILLIN G POT IN DEXTROSE 60000 UNIT/ML IV SOLN
3.0000 10*6.[IU] | INTRAVENOUS | Status: DC
  Administered 2017-03-01 (×2): 3 10*6.[IU] via INTRAVENOUS
  Filled 2017-03-01 (×4): qty 50

## 2017-03-01 MED ORDER — LACTATED RINGERS IV SOLN
500.0000 mL | Freq: Once | INTRAVENOUS | Status: AC
  Administered 2017-03-01: 500 mL via INTRAVENOUS

## 2017-03-01 MED ORDER — LEVOTHYROXINE SODIUM 50 MCG PO TABS
50.0000 ug | ORAL_TABLET | Freq: Every day | ORAL | Status: DC
  Administered 2017-03-02 – 2017-03-03 (×2): 50 ug via ORAL
  Filled 2017-03-01 (×3): qty 1

## 2017-03-01 MED ORDER — FENTANYL 2.5 MCG/ML BUPIVACAINE 1/10 % EPIDURAL INFUSION (WH - ANES)
14.0000 mL/h | INTRAMUSCULAR | Status: DC | PRN
  Administered 2017-03-01: 14 mL/h via EPIDURAL
  Filled 2017-03-01: qty 100

## 2017-03-01 MED ORDER — LACTATED RINGERS IV SOLN: 500.0000 mL | INTRAVENOUS | Status: DC | PRN

## 2017-03-01 MED ORDER — DIBUCAINE 1 % RE OINT: 1.0000 "application " | TOPICAL_OINTMENT | RECTAL | Status: DC | PRN

## 2017-03-01 MED ORDER — ONDANSETRON HCL 4 MG/2ML IJ SOLN: 4.0000 mg | Freq: Four times a day (QID) | INTRAMUSCULAR | Status: DC | PRN

## 2017-03-01 MED ORDER — IBUPROFEN 600 MG PO TABS
600.0000 mg | ORAL_TABLET | Freq: Four times a day (QID) | ORAL | Status: DC
  Administered 2017-03-01 – 2017-03-03 (×6): 600 mg via ORAL
  Filled 2017-03-01 (×6): qty 1

## 2017-03-01 MED ORDER — LIDOCAINE HCL (PF) 1 % IJ SOLN
INTRAMUSCULAR | Status: DC | PRN
  Administered 2017-03-01: 9 mL via EPIDURAL
  Administered 2017-03-01: 5 mL via EPIDURAL

## 2017-03-01 MED ORDER — PRENATAL MULTIVITAMIN CH
1.0000 | ORAL_TABLET | Freq: Every day | ORAL | Status: DC
  Administered 2017-03-02 – 2017-03-03 (×2): 1 via ORAL
  Filled 2017-03-01 (×2): qty 1

## 2017-03-01 MED ORDER — PHENYLEPHRINE 40 MCG/ML (10ML) SYRINGE FOR IV PUSH (FOR BLOOD PRESSURE SUPPORT): 80.0000 ug | PREFILLED_SYRINGE | INTRAVENOUS | Status: DC | PRN

## 2017-03-01 MED ORDER — COCONUT OIL OIL: 1.0000 "application " | TOPICAL_OIL | Status: DC | PRN

## 2017-03-01 MED ORDER — DIPHENHYDRAMINE HCL 50 MG/ML IJ SOLN: 12.5000 mg | INTRAMUSCULAR | Status: DC | PRN

## 2017-03-01 MED ORDER — OXYTOCIN 40 UNITS IN LACTATED RINGERS INFUSION - SIMPLE MED: 2.5000 [IU]/h | INTRAVENOUS | Status: DC

## 2017-03-01 MED ORDER — BENZOCAINE-MENTHOL 20-0.5 % EX AERO
1.0000 "application " | INHALATION_SPRAY | CUTANEOUS | Status: DC | PRN
  Administered 2017-03-02 – 2017-03-03 (×2): 1 via TOPICAL
  Filled 2017-03-01 (×2): qty 56

## 2017-03-01 MED ORDER — TERBUTALINE SULFATE 1 MG/ML IJ SOLN
0.2500 mg | Freq: Once | INTRAMUSCULAR | Status: DC | PRN
  Filled 2017-03-01: qty 1

## 2017-03-01 MED ORDER — ONDANSETRON HCL 4 MG PO TABS: 4.0000 mg | ORAL_TABLET | ORAL | Status: DC | PRN

## 2017-03-01 MED ORDER — OXYTOCIN 40 UNITS IN LACTATED RINGERS INFUSION - SIMPLE MED
INTRAVENOUS | Status: AC
  Filled 2017-03-01: qty 1000

## 2017-03-01 MED ORDER — ONDANSETRON HCL 4 MG/2ML IJ SOLN: 4.0000 mg | INTRAMUSCULAR | Status: DC | PRN

## 2017-03-01 MED ORDER — PHENYLEPHRINE 40 MCG/ML (10ML) SYRINGE FOR IV PUSH (FOR BLOOD PRESSURE SUPPORT)
80.0000 ug | PREFILLED_SYRINGE | INTRAVENOUS | Status: DC | PRN
  Filled 2017-03-01: qty 10

## 2017-03-01 MED ORDER — WITCH HAZEL-GLYCERIN EX PADS: 1.0000 "application " | MEDICATED_PAD | CUTANEOUS | Status: DC | PRN

## 2017-03-01 MED ORDER — SOD CITRATE-CITRIC ACID 500-334 MG/5ML PO SOLN: 30.0000 mL | ORAL | Status: DC | PRN

## 2017-03-01 NOTE — Anesthesia Preprocedure Evaluation (Signed)
Anesthesia Evaluation  Patient identified by MRN, date of birth, ID band Patient awake    Reviewed: Allergy & Precautions, NPO status , Patient's Chart, lab work & pertinent test results  Airway Mallampati: II  TM Distance: >3 FB Neck ROM: Full    Dental  (+) Dental Advisory Given   Pulmonary neg pulmonary ROS,    Pulmonary exam normal breath sounds clear to auscultation       Cardiovascular negative cardio ROS Normal cardiovascular exam Rhythm:Regular Rate:Normal     Neuro/Psych negative neurological ROS  negative psych ROS   GI/Hepatic negative GI ROS, Neg liver ROS,   Endo/Other  Hypothyroidism Morbid obesity  Renal/GU negative Renal ROS  negative genitourinary   Musculoskeletal negative musculoskeletal ROS (+)   Abdominal   Peds  Hematology  (+) anemia ,   Anesthesia Other Findings   Reproductive/Obstetrics (+) Pregnancy PCOS; Twin gestation                             Anesthesia Physical Anesthesia Plan  ASA: II  Anesthesia Plan: Epidural   Post-op Pain Management:    Induction:   PONV Risk Score and Plan: Treatment may vary due to age or medical condition  Airway Management Planned: Natural Airway  Additional Equipment: None  Intra-op Plan:   Post-operative Plan:   Informed Consent: I have reviewed the patients History and Physical, chart, labs and discussed the procedure including the risks, benefits and alternatives for the proposed anesthesia with the patient or authorized representative who has indicated his/her understanding and acceptance.     Plan Discussed with:   Anesthesia Plan Comments: (Labs reviewed. Platelets acceptable, patient not taking any blood thinning medications. Risks and benefits discussed with patient, patient expressed understanding and wished to proceed.)        Anesthesia Quick Evaluation

## 2017-03-01 NOTE — Anesthesia Postprocedure Evaluation (Signed)
Anesthesia Post Note  Patient: Christine Noble  Procedure(s) Performed: * No procedures listed *     Patient location during evaluation: Mother Baby Anesthesia Type: Epidural Level of consciousness: awake and alert Pain management: pain level controlled Vital Signs Assessment: post-procedure vital signs reviewed and stable Respiratory status: spontaneous breathing, nonlabored ventilation and respiratory function stable Cardiovascular status: stable Postop Assessment: no headache, no backache and epidural receding Anesthetic complications: no    Last Vitals:  Vitals:   03/01/17 2030 03/01/17 2032  BP: 134/82 134/82  Pulse: 83 83  Resp:    Temp:    SpO2:      Last Pain:  Vitals:   03/01/17 1557  TempSrc: Oral  PainSc:    Pain Goal:                 Syerra Abdelrahman

## 2017-03-01 NOTE — Anesthesia Procedure Notes (Signed)
Epidural Patient location during procedure: OB Start time: 03/01/2017 3:41 PM End time: 03/01/2017 3:47 PM  Staffing Anesthesiologist: Leslye PeerBROCK, Miquela Costabile E Performed: anesthesiologist   Preanesthetic Checklist Completed: patient identified, pre-op evaluation, timeout performed, IV checked, risks and benefits discussed and monitors and equipment checked  Epidural Patient position: sitting Prep: DuraPrep Patient monitoring: continuous pulse ox and blood pressure Approach: midline Location: L3-L4 Injection technique: LOR saline  Needle:  Needle type: Tuohy  Needle gauge: 17 G Needle length: 9 cm Needle insertion depth: 6 cm Catheter size: 19 Gauge Catheter at skin depth: 12 cm Test dose: negative and Other (1% lidocaine)  Additional Notes Patient identified. Risks including, but not limited to, bleeding, infection, nerve damage, paralysis, inadequate analgesia, blood pressure changes, nausea, vomiting, allergic reaction, postpartum back pain, itching, and headache were discussed. Patient expressed understanding and wished to proceed. Sterile prep and drape, including hand hygiene, mask, and sterile gloves were used. The patient was positioned and the spine was prepped. The skin was anesthetized with lidocaine. No paraesthesia or other complication noted. The patient did not experience any signs of intravascular injection such as tinnitus or metallic taste in mouth, nor signs of intrathecal spread such as rapid motor block. Please see nursing notes for vital signs. The patient tolerated the procedure well.   Leslye Peerhomas Denisa Enterline, MDReason for block:procedure for pain

## 2017-03-01 NOTE — Progress Notes (Signed)
Patient ID: Christine Noble, female   DOB: Mar 03, 1976, 41 y.o.   MRN: [id number] Twin IOL. Di-Di, IVF.  BP 135/82   Pulse 81   Temp (!) 97.5 F (36.4 C)   Resp 18   Ht 5\' 5"  (1.651 m)   Wt 240 lb (108.9 kg)   SpO2 100%   BMI 39.94 kg/m  FHT- 130s- I           140s- I Toco- q 3 min Cx complete/ +1/+2/ OA. Borderline pelvis.  GBS(+), PCN 3 doses.  Start pushing.  OR staff aware.   V.Zygmunt Mcglinn. MD

## 2017-03-01 NOTE — Anesthesia Pain Management Evaluation Note (Signed)
  CRNA Pain Management Visit Note  Patient: Christine Noble, [age] y.o., female  "Hello I am a [facility]. We have an anesthesia team available at all times to provide care throughout the hospital, including epidural management and anesthesia for C-section. I don't know your plan for the delivery whether it a natural birth, water birth, IV sedation, nitrous supplementation, doula or epidural, but we want to meet your pain goals."   1.Was your pain managed to your expectations on prior hospitalizations?   No prior hospitalizations  2.What is your expectation for pain management during this hospitalization?     IV pain meds  3.How can we help you reach that goal? Possible epidural if desired.  Record the patient's initial score and the patient's pain goal.   Pain: 0  Pain Goal: 5 [facility] wants you to be able to say your pain was always managed very well.  Christine Noble 03/01/2017

## 2017-03-01 NOTE — H&P (Signed)
Christine Noble is a 41 y.o. female presenting at 37.3 wks for labor IOL for twin A IUGR.  G1, IVF pregnancy, DiDi twins, AMA, Hypothyroidism, GERD, Anemia, Hx of PCOS.  Growth sono noted SGA with AC lag from 32 wks, and Twin A <10% with AC <1% at last sono on 9/2. Patient had routine office BPP at 36 wks that was 2/8 for twin B but after prolonged monitoring in MAU and repeat BPP 8/8, she was sent home. Both twin BPP have been 8/8 and reactive NST since.  BTMZ at 35 wks for SGA  OB History    Gravida Para Term Preterm AB Living   1         0   SAB TAB Ectopic Multiple Live Births                 Past Medical History:  Diagnosis Date  . Anemia in pregnancy, third trimester 01/27/2017  . PCOS (polycystic ovarian syndrome)   . Pregnancy resulting from in vitro fertilization in third trimester 01/27/2017  . Thyroid disease   . Twin pregnancy, twins dichorionic and diamniotic 01/27/2017   Past Surgical History:  Procedure Laterality Date  . NO PAST SURGERIES     Family History: family history is not on file. Social History:  reports that she has never smoked. She has never used smokeless tobacco. She reports that she does not drink alcohol or use drugs.     Maternal Diabetes: No Genetic Screening: Normal Normal Panorama, AFP1 normal  Maternal Ultrasounds/Referrals: Abnormal:  Findings:   IUGR Fetal Ultrasounds or other Referrals:  Referred to Materal Fetal Medicine  to growth and to complete Twin B anatomy at 28 wks. Maternal Substance Abuse:  No Significant Maternal Medications:  Meds include: Progesterone Zantac Syntroid Significant Maternal Lab Results:  Lab values include: Group B Strep positive Other Comments:  None  ROS  No HA/ CP/ SOB/ vision changes History   There were no vitals taken for this visit. Exam Physical Exam  BP (!) 148/95   Pulse 61   Temp 97.8 F (36.6 C) (Oral)   Resp 18   Ht 5\' 5"  (1.651 m)   Wt 240 lb (108.9 kg)   BMI 39.94 kg/m  Physical exam:  A&O x  3, no acute distress. Pleasant HEENT neg, no thyromegaly Lungs CTA bilat CV RRR,S1S2 normal Abdo soft, non tender, non acute Extr no edema/ tenderness Pelvic 3/50%/-2. Vtx. - office sono Vtx/ Vtx FHT  130s + accels no decels/ mod variab - I          140s + accels no decels/ mod variab- I Toco q 3 min  Prenatal labs: ABO, Rh: B/Positive/-- (03/06 0000) Antibody: Negative (03/06 0000) Rubella: Immune (03/06 0000) RPR: Nonreactive (03/06 0000)  HBsAg: Negative (03/06 0000)  HIV: Non-reactive (03/06 0000)  GBS:  Positive    Assessment/Plan: 41 yo IVF Di-Di twins, 37.3 wks, twin A IUGR and twin B SGA with AC <5%. Here for IOL, Vtx/ Vtx, desired vaginal trial of labor.  Plan PCN for GBS, AROM after 2 doses.  Epidural advised Plan delivery in labor room since both Vx. Risk of C/section, for twin B reviewed at length in office and today. Pt understands.    Braden Cimo R 03/01/2017, 6:55 AM

## 2017-03-01 NOTE — Progress Notes (Signed)
Patient ID: Christine Noble, female   DOB: 22-Aug-1975, 41 y.o.   MRN: [id number] Twin IOL. Di-Di, IVF.  BP (!) 148/95   Pulse 61   Temp 97.8 F (36.6 C) (Oral)   Resp 18   Ht 5\' 5"  (1.651 m)   Wt 240 lb (108.9 kg)   BMI 39.94 kg/m  FHT- 130s- I           140s- I Toco- q 3 min Cx 3-4/ 60%/-2, AROM, clear fluid GBS(+), PCN 2 doses. Continue q 4 hrs Anticipate SVD   V.Cashlyn Huguley. MD

## 2017-03-02 LAB — CBC
HCT: 28 % — ABNORMAL LOW (ref 36.0–46.0)
Hemoglobin: 9.3 g/dL — ABNORMAL LOW (ref 12.0–15.0)
MCH: 24.2 pg — ABNORMAL LOW (ref 26.0–34.0)
MCHC: 33.2 g/dL (ref 30.0–36.0)
MCV: 72.7 fL — ABNORMAL LOW (ref 78.0–100.0)
PLATELETS: 198 10*3/uL (ref 150–400)
RBC: 3.85 MIL/uL — AB (ref 3.87–5.11)
RDW: 15.5 % (ref 11.5–15.5)
WBC: 12.5 10*3/uL — ABNORMAL HIGH (ref 4.0–10.5)

## 2017-03-02 MED ORDER — MAGNESIUM OXIDE 400 (241.3 MG) MG PO TABS
400.0000 mg | ORAL_TABLET | Freq: Every day | ORAL | Status: DC
  Administered 2017-03-03: 400 mg via ORAL
  Filled 2017-03-02 (×3): qty 1

## 2017-03-02 MED ORDER — FERROUS SULFATE 325 (65 FE) MG PO TABS
325.0000 mg | ORAL_TABLET | Freq: Every day | ORAL | Status: DC
  Administered 2017-03-03: 325 mg via ORAL
  Filled 2017-03-02: qty 1

## 2017-03-02 NOTE — Lactation Note (Signed)
This note was copied from a baby's chart. Lactation Consultation Note:  Assist mother with latching infant, Baby "B" on in football hold. Infant sustained latch for 15 mins. Observed a few swallows. Mother has flat but compressible nipple tissue. Mother taught to use a tea-cup hold. Infant placed skin to skin with father after breastfeeding.  Baby ' A", placed in football hold on the left breast. Infant latched on with only a few sucks. Infant was given 15 ml of neosure. Mother was taught to pace bottle feed infant.  Father to feed Baby B a bottle.  Mother was sat up with DEBP and staff nurse to assist after infants do skin to skin. Mother receptive to all teaching .   Patient Name: Christine HakimBoyB Christine Noble BJYNW'GToday's Date: 03/02/2017 Reason for consult: Follow-up assessment   Maternal Data    Feeding Feeding Type: Breast Fed Length of feed: 15 min (good burst of rhythmic suckling)  LATCH Score Latch: Grasps breast easily, tongue down, lips flanged, rhythmical sucking.  Audible Swallowing: A few with stimulation  Type of Nipple: Flat  Comfort (Breast/Nipple): Soft / non-tender  Hold (Positioning): Assistance needed to correctly position infant at breast and maintain latch.  LATCH Score: 7  Interventions    Lactation Tools Discussed/Used     Consult Status Consult Status: Follow-up Date: 03/02/17 Follow-up type: In-patient    Stevan BornKendrick, Donie Moulton [facility] 03/02/2017, 4:49 PM

## 2017-03-02 NOTE — Lactation Note (Signed)
This note was copied from a baby's chart. Lactation Consultation Note New mom w/twins 37 3/7 weeks, SGA, baby "A" 4.7lbs, baby "B" 4.15lbs. Mom is supplementing w/formula. Given 22 cal. Neosure. Unable to hand express colostrum. Strict I&O.  Asked RN to set up DEBP. Discussed w/mom importance of post pumping for stimulation. Mom has hx: PCOS and hypothyroidism.  Mom has wide space breast, "V" shaped, nipple pointing downwards. Semi flat, compresses flat, unable to latch. RN fitted w/#20 NS, mom demonstrated application. LC gave mom #16 NS, mom is to Feed w/both to see which one she likes. Unable to hand express colostrum.  Encouraged mom to call for assistance in latching if needed. Encouraged football hold.   Mom states she will try to BF and hopefully she will have enough milk for both. Explained mom will only know in time. Stressed importance of post pumping. Mom agreed to do so.  Gave mom LPI information sheet. Reviewed supplementing amount, feeding limit time, keeping babies warm etc. Educated on STS, I&O, supply and demand.  WH/LC brochure given w/resources, support groups and LC services.  Patient Name: Christine Noble Rosene Shannon GNFAO'ZToday's Date: 03/02/2017 Reason for consult: Initial assessment;Early term 37-38.6wks;Multiple gestation;Infant < 6lbs   Maternal Data Has patient been taught Hand Expression?: Yes Does the patient have breastfeeding experience prior to this delivery?: No  Feeding Feeding Type: Bottle Fed - Formula  LATCH Score       Type of Nipple: Flat  Comfort (Breast/Nipple): Soft / non-tender        Interventions Interventions: Breast feeding basics reviewed;Position options;Skin to skin;Breast massage;Hand express;Shells;Pre-pump if needed;Hand pump;Breast compression;DEBP (RN to set up DEBP)  Lactation Tools Discussed/Used Tools: Shells;Pump;Nipple Shields Nipple shield size: 16;20 Shell Type: Inverted Breast pump type: Double-Electric Breast Pump;Manual WIC  Program: No   Consult Status Consult Status: Follow-up Date: 03/02/17 Follow-up type: In-patient    Jaidyn Kuhl, Diamond NickelLAURA G 03/02/2017, 4:07 AM

## 2017-03-02 NOTE — Addendum Note (Signed)
Addendum  created 03/02/17 1023 by Nate Perri, Doree Fudgeolleen S, CRNA   Sign clinical note, Visit diagnoses modified

## 2017-03-02 NOTE — Anesthesia Postprocedure Evaluation (Signed)
Anesthesia Post Note  Patient: Christine Noble  Procedure(s) Performed: * No procedures listed *     Patient location during evaluation: Mother Baby Anesthesia Type: Epidural Level of consciousness: awake, awake and alert and oriented Pain management: pain level controlled Vital Signs Assessment: post-procedure vital signs reviewed and stable Respiratory status: spontaneous breathing, nonlabored ventilation and respiratory function stable Cardiovascular status: stable Postop Assessment: no headache, no backache, patient able to bend at knees, no signs of nausea or vomiting and adequate PO intake Anesthetic complications: no    Last Vitals:  Vitals:   03/02/17 0345 03/02/17 0600  BP: 126/68 108/67  Pulse: 70 69  Resp: 17 16  Temp: 36.5 C 36.7 C  SpO2: 100%     Last Pain:  Vitals:   03/02/17 0926  TempSrc:   PainSc: 0-No pain   Pain Goal:                 Cythina Mickelsen

## 2017-03-02 NOTE — Progress Notes (Addendum)
Post Partum Day 1, SVD, Episiotomy. DiDi twin BOYS (SGA)  - NO CIRC Subjective: no complaints, up ad lib, voiding, tolerating PO, + flatus and some perineal pain  Objective: Blood pressure 108/67, pulse 69, temperature 98 F (36.7 C), temperature source Oral, resp. rate 16, height 5\' 5"  (1.651 m), weight 240 lb (108.9 kg), SpO2 100 %, unknown if currently breastfeeding.  Physical Exam:  General: alert and cooperative Lochia: appropriate Uterine Fundus: firm Incision: perineal - epis DVT Evaluation: No evidence of DVT seen on physical exam.   Recent Labs  03/01/17 1318 03/02/17 0558  HGB 11.4* 9.3*  HCT 35.5* 28.0*   B(+) Rub Immune S/p TDAP   Assessment/Plan: PPD #1, SVD twins.  Pp care. Routine orders. Breast/ bottle feeding. Boys in room  Peri-care Chronic anemia, continue Iron (try oral) Hypothyroid- continue Synthroid 50mcg.  Possible D/c home tomorrow   LOS: 1 day   Kathlene Yano R 03/02/2017, 9:58 AM

## 2017-03-02 NOTE — Progress Notes (Signed)
RN in room with patient assisting with feeding babies. After feeding, patient asked to sit on the side of the bed. RN asked her if able to sit on side of bed, patient confirmed. RN assisted her sitting on side of the bed, with feet flat on the floor. Patient felt that she could stand up, did so with no issue. Patient felt that her peripad was twisted up. RN and patient both looked down to fix the peripad and patient fell to the floor on her buttocks. Second RN called in to assist with getting patient up. Two minute wait to see how patient felt. She stated no pain, nothing injured, needing to use the bathroom. Assisted with stedy. Midwife Carlean JewsMeredith Sigmon contacted.  Newman PiesWiggins, Shaima Sardinas T, RN

## 2017-03-03 MED ORDER — BENZOCAINE-MENTHOL 20-0.5 % EX AERO: 1.0000 "application " | INHALATION_SPRAY | CUTANEOUS | 2 refills | Status: AC | PRN

## 2017-03-03 MED ORDER — IBUPROFEN 800 MG PO TABS: 800.0000 mg | ORAL_TABLET | Freq: Three times a day (TID) | ORAL | 0 refills | Status: AC

## 2017-03-03 MED ORDER — FERROUS SULFATE 325 (65 FE) MG PO TABS: 325.0000 mg | ORAL_TABLET | Freq: Every day | ORAL | 0 refills | Status: AC

## 2017-03-03 MED ORDER — WITCH HAZEL-GLYCERIN EX PADS: 1.0000 "application " | MEDICATED_PAD | CUTANEOUS | 12 refills | Status: AC | PRN

## 2017-03-03 MED ORDER — DIBUCAINE 1 % RE OINT
1.0000 | TOPICAL_OINTMENT | RECTAL | 0 refills | Status: AC | PRN
Start: 2017-03-03 — End: ?

## 2017-03-03 MED ORDER — MAGNESIUM OXIDE 400 (241.3 MG) MG PO TABS: 400.0000 mg | ORAL_TABLET | Freq: Every day | ORAL | 2 refills | Status: AC

## 2017-03-03 MED ORDER — IBUPROFEN 800 MG PO TABS
800.0000 mg | ORAL_TABLET | Freq: Three times a day (TID) | ORAL | Status: DC
  Administered 2017-03-03: 800 mg via ORAL
  Filled 2017-03-03: qty 1

## 2017-03-03 NOTE — Lactation Note (Addendum)
This note was copied from a baby's chart. Lactation Consultation Note  Patient Name: Christine Noble [name] ZOXWR'UToday's Date: 03/03/2017   Mom assisted w/pumping for the 1st time on the preemie setting. Mom encouraged to do so q3hrs. 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 Mom with PCOS, hypothyroid, & conceived with IVF.   With Baby A, MGM was assisted with putting more of the bottle teat in infant's mouth so that he would feed more effectively.   With Baby B, Mom was assisted w/burping baby to see if he would be willing to increase his volumes.  Lurline HareRichey, Jaquarious Grey [facility] 03/03/2017, 3:57 PM

## 2017-03-03 NOTE — Discharge Summary (Signed)
Obstetric Discharge Summary  Reason for Admission: induction of labor - AMA with IVF pregnancy- DI-DI twin gestation with IUGR in twin A Prenatal Procedures: NST and ultrasound Intrapartum Procedures: Epidural anesthesia, Left mediolateral episiotomy, SVD twin A, VAVD  tiwn B Postpartum Procedures: none Complications-Operative and Postpartum: none Hemoglobin  Date Value Ref Range Status  03/02/2017 9.3 (L) 12.0 - 15.0 g/dL Final   HCT  Date Value Ref Range Status  03/02/2017 28.0 (L) 36.0 - 46.0 % Final    Physical Exam:  General: alert, cooperative and no distress Lochia: appropriate Uterine Fundus: firm Incision: healing well DVT Evaluation: No evidence of DVT seen on physical exam.  Discharge Diagnoses: Term Pregnancy-delivered with twins  Discharge Information: Date: 03/03/2017 Activity: pelvic rest Diet: routine Medications: PNV, Ibuprofen and iron and magnesium, and synthroid.  Condition: stable Instructions: refer to practice specific booklet Discharge to: home Follow-up Information    Christine EvansMody, [name], MD. Schedule an appointment as soon as possible for a visit in 6 week(s).   Specialty:  Obstetrics and Gynecology Why:  call for office visit sooner if persistent pain near stitches Contact information: Christine Noble           Newborn Data:   Christine Noble, Christine Noble [[id number]][[id number]]  Live born female  Birth Weight: 4 lb 7.3 oz (2021 g) APGAR: 9, 9   Christine Noble, Christine Noble [[id number]][[id number]]  Live born female  Birth Weight: 4 lb 15 oz (2240 g) APGAR: 8, 9  Home with mother.  Christine Noble 03/03/2017, 8:11 AM

## 2017-03-03 NOTE — Lactation Note (Signed)
This note was copied from a baby's chart. Lactation Consultation Note  Patient Name: Christine BostonBoyA Christine Noble's Date: 03/03/2017   Mom reports minor breast changes with pregnancy (darkening of areola & small increase in size in L breast). Mom w/PCOS, IVF, & hypothyroid. Mom is on Synthroid.   She is not interested in putting the babies to the breast at this time, but she is interested in pumping. She has not used the DEBP in her room b/c she did not know how to use it. Mom does not have a pump at home & does not have WIC.  Mom has my # to call when she is ready for me to return to help her pump.    Lurline HareRichey, Cristian Grieves [facility] 03/03/2017, 8:57 AM

## 2017-03-03 NOTE — Progress Notes (Signed)
PPD 2 SVD vaginal delivery of twins with L mediolateral episiotomy and repair  S:  Reports feeling sore in her bottom - "burning is miserable" / very tired - no sleep with babies             Tolerating po/ No nausea or vomiting             Bleeding is light             Pain minimally controlled with motrin / Dermaplast not effective             Up ad lib / ambulatory / voiding QS  Newborn twins formula feeding    O:  VS: BP (!) 101/56 (BP Location: Right Arm)   Pulse 70   Temp 98.2 F (36.8 C) (Oral)   Resp 16   Ht 5\' 5"  (1.651 m)   Wt 108.9 kg (240 lb)   SpO2 100%   Breastfeeding? Unknown   BMI 39.94 kg/m    LABS:              Recent Labs  03/01/17 1318 03/02/17 0558  WBC 6.8 12.5*  HGB 11.4* 9.3*  PLT 237 198               Blood type: --/--/B POS, B POS (09/11 0831)  Rubella: Immune (03/06 0000)                      Physical Exam:             Alert and oriented X3  Abdomen: soft, non-tender, non-distended              Fundus: firm, non-tender, U-1  Perineum: moderate edema  Lochia: light  Extremities: trace edema, no calf pain or tenderness  A: PPD # 2 SVD twins with ML episiotomy repair              ABL anemia - stable             Unresolved pain  Doing well - stable status  P: Routine post partum orders             Iron and magnesium x 6 weeks with PNV  DC home but rooming-in with babies next 24hrs - WOB booklet - instructions reviewed             Adjust motrin dose - encouraged Tucks pads(cold) - warm tub soaks at home - topical gel for pain with repair  [name], TANYA CNM, MSN, [facility] 03/03/2017, 7:15 AM
